# Patient Record
Sex: Female | Born: 2000
Health system: Southern US, Community
[De-identification: ages and names within clinical notes are randomized; demographics above are authoritative.]

## PROBLEM LIST (undated history)

## (undated) DIAGNOSIS — Z789 Other specified health status: Secondary | ICD-10-CM

## (undated) DIAGNOSIS — L709 Acne, unspecified: Secondary | ICD-10-CM

## (undated) DIAGNOSIS — B009 Herpesviral infection, unspecified: Secondary | ICD-10-CM

## (undated) HISTORY — PX: TONSILLECTOMY: SUR1361

## (undated) HISTORY — DX: Herpesviral infection, unspecified: B00.9

## (undated) HISTORY — DX: Acne, unspecified: L70.9

---
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
## 2018-01-01 NOTE — Telephone Encounter (Signed)
Phone call to patient's mother Lauren Coleman. Notified letter has been sent to Mychart, reviewed need for patient follow up appointment if fungus is worsening. Lauren Coleman states she will call back to make appointment later after reviewing Yannet's schedule.

## 2018-01-02 ENCOUNTER — Ambulatory Visit: Payer: BLUE CROSS/BLUE SHIELD | Admitting: Family Medicine

## 2018-01-04 ENCOUNTER — Encounter: Payer: Self-pay | Admitting: Physician Assistant

## 2018-01-04 ENCOUNTER — Ambulatory Visit: Payer: BLUE CROSS/BLUE SHIELD | Admitting: Physician Assistant

## 2018-01-04 ENCOUNTER — Other Ambulatory Visit: Payer: Self-pay

## 2018-01-04 VITALS — BP 118/80 | HR 115 | Temp 98.9°F | Resp 18 | Ht 66.0 in | Wt 170.6 lb

## 2018-01-04 DIAGNOSIS — L608 Other nail disorders: Secondary | ICD-10-CM

## 2018-01-04 DIAGNOSIS — B009 Herpesviral infection, unspecified: Secondary | ICD-10-CM | POA: Diagnosis not present

## 2018-01-04 MED ORDER — VALACYCLOVIR HCL 1 G PO TABS: 2000.0000 mg | ORAL_TABLET | Freq: Two times a day (BID) | ORAL | 2 refills | Status: DC

## 2018-01-04 MED ORDER — TERBINAFINE HCL 250 MG PO TABS: 250.0000 mg | ORAL_TABLET | Freq: Every day | ORAL | 1 refills | Status: DC

## 2018-01-04 NOTE — Progress Notes (Signed)
Lauren Coleman  MRN: [id number] DOB: [birth year]  PCP: Morrell Riddle, PA-C  Chief Complaint  Patient presents with  . Nail Problem    right foot little 4th and 5th toes follow up saw Dr. Neva Seat [phone]     Subjective:  Pt presents to clinic for concerns that she has toenail fungus.  Several months ago she was seen for discoloration of her fifth toe and was treated for cellulitis with antibiotics.  Since that time she is continued to have worsening yellowing/whitening roughness fourth and fifth toe on right foot.  Her father toenail fungus on all 10 fingers in the shower in the same shower patient is worried she has a fungus.  She does have dry peeling skin on the bottom of her feet which she has had for a long time.  She has a long history of oral HSV and is out of her Valtrex and would like a refill for that today.  History is obtained by patient and mother.  Review of Systems  Constitutional: Negative for chills and fever.    There are no active problems to display for this patient.   Current Outpatient Medications on File Prior to Visit  Medication Sig Dispense Refill  . Hyoscyamine Sulfate SL (LEVSIN/SL) 0.125 MG SUBL Place 0.125 mg under the tongue 3 (three) times daily as needed. For abdominal cramping 30 each 0  . SPRINTEC 28 0.25-35 MG-MCG tablet   11   No current facility-administered medications on file prior to visit.     No Known Allergies  Past Medical History:  Diagnosis Date  . Acne   . HSV-1 infection    Social History   Social History Narrative  . Not on file   Social History   Tobacco Use  . Smoking status: Never Smoker  . Smokeless tobacco: Never Used  Substance Use Topics  . Alcohol use: No    Alcohol/week: 0.0 oz  . Drug use: No   family history includes Anxiety disorder in her father; Arthritis in her maternal grandfather; Brain cancer in her maternal grandfather; Breast cancer in her maternal aunt; Colitis in her mother; Colon cancer in  her maternal uncle; Diabetes in her paternal grandfather; Healthy in her paternal uncle; Hyperlipidemia in her father and paternal grandmother; Hypertension in her father, maternal grandmother, and paternal grandmother; Hypothyroidism in her maternal grandmother; Ovarian cancer in her maternal grandmother; Sarcoidosis in her mother; Stroke in her paternal grandfather.     Objective:  BP 118/80   Pulse (!) 115   Temp 98.9 F (37.2 C) (Oral)   Resp 18   Ht  (1.676 m)   Wt 170 lb 9.6 oz (77.4 kg)   LMP 01/01/2018   SpO2 97%   BMI 27.54 kg/m  Body mass index is 27.54 kg/m.  Physical Exam  Constitutional: She is oriented to person, place, and time. She appears well-developed and well-nourished.  HENT:  Head: Normocephalic and atraumatic.  Right Ear: Hearing and external ear normal.  Left Ear: Hearing and external ear normal.  Eyes: Conjunctivae are normal.  Neck: Normal range of motion.  Pulmonary/Chest: Effort normal.  Neurological: She is alert and oriented to person, place, and time.  Skin: Skin is warm, dry and intact.  Right fourth toe nail a small amount 1/4 part of nail white and rough.  Right fifth toenail rough and approximately 50% white. no thickening of the nail noticed.  End of nail not involved.  Bottom of her foot is mildly  erythematous and scaling consistent with tinea pedis.  Psychiatric: She has a normal mood and affect. Her behavior is normal. Judgment and thought content normal.  Vitals reviewed.   Assessment and Plan :  Change in nail appearance - Plan: Hepatic Function Panel, terbinafine (LAMISIL) 250 MG tablet -100% sure this is actually toenail fungus.  But she definitely has tinea PDS.  There is no nail to trim which is affected to send for culture today.  We will check her liver functions and start Lamisil if normal.  She will recheck with me in 6 weeks at that time we will reevaluate  HSV infection - Plan: valACYclovir (VALTREX) 1000 MG tablet refilled  medication for patient.-   Patient verbalized to me that she understood the following: their diagnosis, what is being done for it, what to expect and what should be done at home.  See after visit summary for patient specific instructions.     Benny Lennert PA-C  Primary Care at [facility] 01/04/2018 12:57 PM

## 2018-01-04 NOTE — Patient Instructions (Signed)
     IF you received an x-ray today, you will receive an invoice from [facility]. Please contact [facility] at [phone] with questions or concerns regarding your invoice.   IF you received labwork today, you will receive an invoice from [facility]. Please contact [facility] at [phone] with questions or concerns regarding your invoice.   Our billing staff will not be able to assist you with questions regarding bills from these companies.  You will be contacted with the lab results as soon as they are available. The fastest way to get your results is to activate your My Chart account. Instructions are located on the last page of this paperwork. If you have not heard from us regarding the results in 2 weeks, please contact this office.     

## 2018-01-05 LAB — HEPATIC FUNCTION PANEL
ALK PHOS: 83 IU/L (ref 43–101)
ALT: 13 IU/L (ref 0–32)
AST: 18 IU/L (ref 0–40)
Albumin: 4.5 g/dL (ref 3.5–5.5)
Bilirubin Total: 0.3 mg/dL (ref 0.0–1.2)
Bilirubin, Direct: 0.09 mg/dL (ref 0.00–0.40)
TOTAL PROTEIN: 6.9 g/dL (ref 6.0–8.5)

## 2018-02-06 ENCOUNTER — Ambulatory Visit: Payer: BLUE CROSS/BLUE SHIELD | Admitting: Physician Assistant

## 2018-02-06 ENCOUNTER — Encounter: Payer: Self-pay | Admitting: Physician Assistant

## 2018-02-06 ENCOUNTER — Other Ambulatory Visit: Payer: Self-pay

## 2018-02-06 VITALS — BP 112/78 | HR 111 | Temp 97.9°F | Resp 18 | Ht 66.0 in | Wt 168.4 lb

## 2018-02-06 DIAGNOSIS — R300 Dysuria: Secondary | ICD-10-CM | POA: Diagnosis not present

## 2018-02-06 DIAGNOSIS — N3001 Acute cystitis with hematuria: Secondary | ICD-10-CM

## 2018-02-06 DIAGNOSIS — L608 Other nail disorders: Secondary | ICD-10-CM | POA: Diagnosis not present

## 2018-02-06 LAB — POC MICROSCOPIC URINALYSIS (UMFC): Mucus: ABSENT

## 2018-02-06 LAB — POCT URINALYSIS DIP (MANUAL ENTRY)
Bilirubin, UA: NEGATIVE
Glucose, UA: 100 mg/dL — AB
Ketones, POC UA: NEGATIVE mg/dL
Nitrite, UA: POSITIVE — AB
Protein Ur, POC: 30 mg/dL — AB
Urobilinogen, UA: 1 E.U./dL
pH, UA: 5 (ref 5.0–8.0)

## 2018-02-06 MED ORDER — NITROFURANTOIN MONOHYD MACRO 100 MG PO CAPS: 100.0000 mg | ORAL_CAPSULE | Freq: Two times a day (BID) | ORAL | 0 refills | Status: DC

## 2018-02-06 MED ORDER — TERBINAFINE HCL 250 MG PO TABS: 250.0000 mg | ORAL_TABLET | Freq: Every day | ORAL | 0 refills | Status: DC

## 2018-02-06 NOTE — Patient Instructions (Signed)
     IF you received an x-ray today, you will receive an invoice from [facility]. Please contact [facility] at [phone] with questions or concerns regarding your invoice.   IF you received labwork today, you will receive an invoice from [facility]. Please contact [facility] at [phone] with questions or concerns regarding your invoice.   Our billing staff will not be able to assist you with questions regarding bills from these companies.  You will be contacted with the lab results as soon as they are available. The fastest way to get your results is to activate your My Chart account. Instructions are located on the last page of this paperwork. If you have not heard from us regarding the results in 2 weeks, please contact this office.     

## 2018-02-06 NOTE — Progress Notes (Signed)
Lauren Coleman  MRN: [id number] DOB: 05/02/2001  PCP: Morrell RiddleWeber, Sarah L, PA-C  Chief Complaint  Patient presents with  . Nail Problem    follow up  . Urinary Tract Infection    dysuria     Subjective:  Pt presents to clinic for 2 concerns 1- tolerating the lamasil fine - feels like the new toenail growth is smoother  2- went to beach with boyfriend 10 days ago - used old spice soap in her vaginal area - dysuria - having bladder spasms and then azo she took last night and it helped the spasm a lot. Urinary freuqency and urgerncy  Planning on going to [employer] for 2 years and then wants to transfer to [place]  History is obtained by patient.  Review of Systems  Constitutional: Negative for chills and fever.  Gastrointestinal: Negative for nausea and vomiting.  Genitourinary: Positive for dysuria, frequency and urgency. Negative for menstrual problem, vaginal bleeding and vaginal discharge.    There are no active problems to display for this patient.   Current Outpatient Medications on File Prior to Visit  Medication Sig Dispense Refill  . Hyoscyamine Sulfate SL (LEVSIN/SL) 0.125 MG SUBL Place 0.125 mg under the tongue 3 (three) times daily as needed. For abdominal cramping 30 each 0  . SPRINTEC 28 0.25-35 MG-MCG tablet   11  . valACYclovir (VALTREX) 1000 MG tablet Take 2 tablets (2,000 mg total) by mouth 2 (two) times daily. For 1 day at first sign of outbreak 30 tablet 2   No current facility-administered medications on file prior to visit.     No Known Allergies  Past Medical History:  Diagnosis Date  . Acne   . HSV-1 infection    Social History   Social History Narrative   Lives with parents      Going to [employer] Fall 2019 - plan if for 2 years and then [place] - wants to work in [employer]   Social History   Tobacco Use  . Smoking status: Never Smoker  . Smokeless tobacco: Never Used  Substance Use Topics  . Alcohol use: No    Alcohol/week: 0.0 oz  . Drug  use: No   family history includes Anxiety disorder in her father; Arthritis in her maternal grandfather; Brain cancer in her maternal grandfather; Breast cancer in her maternal aunt; Colitis in her mother; Colon cancer in her maternal uncle; Diabetes in her paternal grandfather; Healthy in her paternal uncle; Hyperlipidemia in her father and paternal grandmother; Hypertension in her father, maternal grandmother, and paternal grandmother; Hypothyroidism in her maternal grandmother; Ovarian cancer in her maternal grandmother; Sarcoidosis in her mother; Stroke in her paternal grandfather.     Objective:  BP 112/78   Pulse (!) 111   Temp 97.9 F (36.6 C) (Oral)   Resp 18   Ht 5\' 6"  (1.676 m)   Wt 168 lb 6.4 oz (76.4 kg)   LMP 01/29/2018   SpO2 98%   BMI 27.18 kg/m  Body mass index is 27.18 kg/m.  Wt Readings from Last 3 Encounters:  02/06/18 168 lb 6.4 oz (76.4 kg) (92 %, Z= 1.40)*  01/04/18 170 lb 9.6 oz (77.4 kg) (93 %, Z= 1.46)*  12/10/17 170 lb 6.4 oz (77.3 kg) (93 %, Z= 1.46)*   * Growth percentiles are based on CDC (Girls, 2-20 Years) data.    Physical Exam  Constitutional: She is oriented to person, place, and time. She appears well-developed and well-nourished.  HENT:  Head:  Normocephalic and atraumatic.  Right Ear: External ear normal.  Left Ear: External ear normal.  Eyes: Conjunctivae are normal.  Neck: Normal range of motion.  Cardiovascular: Normal rate, regular rhythm and normal heart sounds.  No murmur heard. Pulmonary/Chest: Effort normal and breath sounds normal.  Abdominal: Soft. There is tenderness in the suprapubic area. There is no CVA tenderness.  Neurological: She is alert and oriented to person, place, and time.  Skin: Skin is warm, dry and intact.  Psychiatric: She has a normal mood and affect. Her behavior is normal. Judgment and thought content normal.  Vitals reviewed.  Results for orders placed or performed in visit on 02/06/18  POCT urinalysis  dipstick  Result Value Ref Range   Color, UA orange (A) yellow   Clarity, UA clear clear   Glucose, UA =100 (A) negative mg/dL   Bilirubin, UA negative negative   Ketones, POC UA negative negative mg/dL   Spec Grav, UA <=1.610 (A) 1.010 - 1.025   Blood, UA moderate (A) negative   pH, UA 5.0 5.0 - 8.0   Protein Ur, POC =30 (A) negative mg/dL   Urobilinogen, UA 1.0 0.2 or 1.0 E.U./dL   Nitrite, UA Positive (A) Negative   Leukocytes, UA Large (3+) (A) Negative  POCT Microscopic Urinalysis ([facility])  Result Value Ref Range   WBC,UR,HPF,POC Too numerous to count  (A) None WBC/hpf   RBC,UR,HPF,POC Few (A) None RBC/hpf   Bacteria Moderate (A) None, Too numerous to count   Mucus Absent Absent   Epithelial Cells, UR Per Microscopy Few (A) None, Too numerous to count cells/hpf    Assessment and Plan :  Dysuria - Plan: POCT urinalysis dipstick, POCT Microscopic Urinalysis ([facility]), Urine Culture - push fluids - finish all medications -   Change in nail appearance - Plan: Hepatic Function Panel, terbinafine (LAMISIL) 250 MG tablet - continue for a total of 3 months - appears to be improved  Acute cystitis with hematuria - Plan: nitrofurantoin, macrocrystal-monohydrate, (MACROBID) 100 MG capsule  Patient verbalized to me that they understand the following: diagnosis, what is being done for them, what to expect and what should be done at home.  Their questions have been answered.  See after visit summary for patient specific instructions.  Benny Lennert PA-C  Primary Care at [facility] 02/06/2018 11:07 AM  Please note: Portions of this report may have been transcribed using dragon voice recognition software. Every effort was made to ensure accuracy; however, inadvertent computerized transcription errors may be present.

## 2018-02-07 LAB — HEPATIC FUNCTION PANEL
ALT: 35 IU/L — ABNORMAL HIGH (ref 0–32)
AST: 25 IU/L (ref 0–40)
Albumin: 4.5 g/dL (ref 3.5–5.5)
Alkaline Phosphatase: 87 IU/L (ref 43–101)
BILIRUBIN TOTAL: 0.3 mg/dL (ref 0.0–1.2)
BILIRUBIN, DIRECT: 0.08 mg/dL (ref 0.00–0.40)
TOTAL PROTEIN: 6.5 g/dL (ref 6.0–8.5)

## 2018-02-08 LAB — URINE CULTURE

## 2018-05-31 ENCOUNTER — Inpatient Hospital Stay (HOSPITAL_COMMUNITY)
Admission: AD | Admit: 2018-05-31 | Discharge: 2018-06-01 | Disposition: A | Discharge status: Home / Self Care | Payer: BLUE CROSS/BLUE SHIELD | Source: Ambulatory Visit | Attending: Obstetrics and Gynecology | Admitting: Obstetrics and Gynecology

## 2018-05-31 DIAGNOSIS — R109 Unspecified abdominal pain: Secondary | ICD-10-CM

## 2018-05-31 DIAGNOSIS — M545 Low back pain: Secondary | ICD-10-CM | POA: Diagnosis not present

## 2018-05-31 DIAGNOSIS — R1030 Lower abdominal pain, unspecified: Secondary | ICD-10-CM | POA: Insufficient documentation

## 2018-05-31 HISTORY — DX: Other specified health status: Z78.9

## 2018-05-31 LAB — URINALYSIS, ROUTINE W REFLEX MICROSCOPIC
BILIRUBIN URINE: NEGATIVE
Glucose, UA: NEGATIVE mg/dL
HGB URINE DIPSTICK: NEGATIVE
Ketones, ur: NEGATIVE mg/dL
NITRITE: NEGATIVE
Protein, ur: NEGATIVE mg/dL
SPECIFIC GRAVITY, URINE: 1.024 (ref 1.005–1.030)
pH: 6 (ref 5.0–8.0)

## 2018-05-31 LAB — POCT PREGNANCY, URINE: PREG TEST UR: NEGATIVE

## 2018-05-31 NOTE — MAU Note (Addendum)
For last 2 wks having bad period-like cramps but worse. Tonight pain is horrible and hard to get out of bed. Pain is in lower abd and goes to back. Today spotted some pink to dark red blood. Took ibuprofen 3 200mg  tabs at 2145 and helped alittle

## 2018-06-01 ENCOUNTER — Inpatient Hospital Stay (HOSPITAL_COMMUNITY): Payer: BLUE CROSS/BLUE SHIELD

## 2018-06-01 ENCOUNTER — Encounter (HOSPITAL_COMMUNITY): Payer: Self-pay | Admitting: *Deleted

## 2018-06-01 DIAGNOSIS — R1030 Lower abdominal pain, unspecified: Secondary | ICD-10-CM | POA: Diagnosis not present

## 2018-06-01 DIAGNOSIS — M545 Low back pain: Secondary | ICD-10-CM | POA: Diagnosis not present

## 2018-06-01 LAB — WET PREP, GENITAL
CLUE CELLS WET PREP: NONE SEEN
Sperm: NONE SEEN
Trich, Wet Prep: NONE SEEN
Yeast Wet Prep HPF POC: NONE SEEN

## 2018-06-01 LAB — CBC
HEMATOCRIT: 35.3 % — AB (ref 36.0–46.0)
Hemoglobin: 12.3 g/dL (ref 12.0–15.0)
MCH: 29.4 pg (ref 26.0–34.0)
MCHC: 34.8 g/dL (ref 30.0–36.0)
MCV: 84.2 fL (ref 80.0–100.0)
NRBC: 0 % (ref 0.0–0.2)
Platelets: 362 10*3/uL (ref 150–400)
RBC: 4.19 MIL/uL (ref 3.87–5.11)
RDW: 13.2 % (ref 11.5–15.5)
WBC: 8.8 10*3/uL (ref 4.0–10.5)

## 2018-06-01 LAB — HCG, QUANTITATIVE, PREGNANCY: hCG, Beta Chain, Quant, S: <1 m[IU]/mL (ref <5)

## 2018-06-01 NOTE — Discharge Instructions (Signed)
Your ultrasound was normal and did not show any ovarian cysts, twisted ovaries or fluid in the abdominal cavity. Your lab work was all unremarkable. If your pain persists please follow up with your PCP. You need to be seen if you have new vaginal discharge, pain with intercourse, missed menstrual period, fevers, or vomiting.

## 2018-06-01 NOTE — MAU Provider Note (Signed)
History     CSN: [id number]  Arrival date and time: 05/31/18 2208   First Provider Initiated Contact with Patient 06/01/18 0052      Chief Complaint  Patient presents with  . Abdominal Pain   HPI   Lauren Coleman is a 17 y.o. G0P0000 female who presents to [facility]U with lower quadrant abdominal pain. Reports she has had intermittent cramping pain on both sides (sometimes unilateral and sometimes bilateral) for the past two weeks that has felt similar to period cramps. Her LMP was 9/25 and was normal. No recent missed menses. Denies history of irregular or painful menstrual cycles. Patient is sexually active. Has never had STD. Denies vaginal discharge or odor. Denies nausea, vomiting and fevers.    OB History    Gravida  0   Para  0   Term  0   Preterm  0   AB  0   Living  0     SAB  0   TAB  0   Ectopic  0   Multiple  0   Live Births  0           Past Medical History:  Diagnosis Date  . Acne   . HSV-1 infection   . Medical history non-contributory     Past Surgical History:  Procedure Laterality Date  . TONSILLECTOMY      Family History  Problem Relation Age of Onset  . Colitis Mother   . Sarcoidosis Mother   . Hypertension Father   . Hyperlipidemia Father   . Anxiety disorder Father   . Ovarian cancer Maternal Grandmother   . Hypertension Maternal Grandmother   . Hypothyroidism Maternal Grandmother   . Brain cancer Maternal Grandfather   . Arthritis Maternal Grandfather   . Hyperlipidemia Paternal Grandmother   . Hypertension Paternal Grandmother   . Diabetes Paternal Grandfather   . Stroke Paternal Grandfather   . Colon cancer Maternal Uncle   . Breast cancer Maternal Aunt   . Healthy Paternal Uncle     Social History   Tobacco Use  . Smoking status: Never Smoker  . Smokeless tobacco: Never Used  Substance Use Topics  . Alcohol use: No    Alcohol/week: 0.0 standard drinks  . Drug use: No    Allergies: No Known  Allergies  Medications Prior to Admission  Medication Sig Dispense Refill Last Dose  . acetaminophen (TYLENOL) 325 MG tablet Take 650 mg by mouth every 6 (six) hours as needed.   Past Month at Unknown time  . ibuprofen (ADVIL,MOTRIN) 200 MG tablet Take 600 mg by mouth every 6 (six) hours as needed.   05/31/2018 at Unknown time  . SPRINTEC 28 0.25-35 MG-MCG tablet   11 05/31/2018 at Unknown time  . Hyoscyamine Sulfate SL (LEVSIN/SL) 0.125 MG SUBL Place 0.125 mg under the tongue 3 (three) times daily as needed. For abdominal cramping 30 each 0 Taking  . nitrofurantoin, macrocrystal-monohydrate, (MACROBID) 100 MG capsule Take 1 capsule (100 mg total) by mouth 2 (two) times daily. 14 capsule 0   . terbinafine (LAMISIL) 250 MG tablet Take 1 tablet (250 mg total) by mouth daily. 30 tablet 0   . valACYclovir (VALTREX) 1000 MG tablet Take 2 tablets (2,000 mg total) by mouth 2 (two) times daily. For 1 day at first sign of outbreak 30 tablet 2 Taking    Review of Systems  Constitutional: Negative for activity change, appetite change, chills, fatigue and fever.  Respiratory: Negative for shortness of  breath.   Cardiovascular: Negative for chest pain.  Gastrointestinal: Positive for abdominal pain. Negative for constipation, diarrhea, nausea and vomiting.  Genitourinary: Negative for decreased urine volume, difficulty urinating, dysuria, flank pain, frequency, hematuria, urgency, vaginal discharge and vaginal pain.  Skin: Negative for rash.   Physical Exam   Blood pressure 130/82, pulse 85, temperature 98.2 F (36.8 C), resp. rate 18, height 5' 6.5" (1.689 m), weight 80.3 kg, last menstrual period 05/08/2018.  Physical Exam  Constitutional: She is oriented to person, place, and time. She appears well-developed and well-nourished. No distress.  HENT:  Head: Normocephalic and atraumatic.  Eyes: Conjunctivae and EOM are normal.  Neck: Normal range of motion. Neck supple.  Cardiovascular: Normal  rate, regular rhythm and normal heart sounds.  No murmur heard. Respiratory: Effort normal and breath sounds normal. No respiratory distress.  GI: Soft. Bowel sounds are normal. She exhibits no distension.  TTP diffusely across lower quadrants, non-localizing. No rebound or guarding present.   Genitourinary: Vagina normal and uterus normal. Uterus is not tender. Cervix exhibits no motion tenderness, no discharge and no friability. Right adnexum displays no mass and no tenderness. Left adnexum displays no mass and no tenderness. No vaginal discharge found.  Musculoskeletal: Normal range of motion.  Neurological: She is alert and oriented to person, place, and time.  Skin: Skin is warm and dry. She is not diaphoretic.  Psychiatric: She has a normal mood and affect. Her behavior is normal.    MAU Course  Procedures  MDM Pelvic exam performed and unremarkable. Wet prep with moderate WBCs, otherwise unremarkable. GC/chlamydia collected. Transvaginal ultrasound obtained due to bilateral lower abdominal tenderness. Scan negative.   Assessment and Plan   1. Lower abdominal pain   2. Abdominal pain    Upreg negative and Bhcg <1 ruling out early pregnancy as etiology of symptoms.Transvaginal ultrasound negative ruling out ovarian cyst, torsion, or TOA as etiology of symptoms. Alvarado score of 2: unlikely appendicitis.Patient without evidence of cervicitis on exam and no CMT so lower suspicion for PID. GC/chlamydia pending. CBC without leukocytosis and patient afebrile with stable vital signs making infectious process less likely. Strict return precautions discussed. Patient stable for d/c home. Low suspicion for acute process needing urgent treatment. If symptoms persist, would recommend follow up with PCP.   De Hollingshead 06/01/2018, 12:52 AM

## 2018-06-01 NOTE — Progress Notes (Signed)
Written and verbal d/c instructions given by Vira Blanco RN. Pt voiced understanding

## 2018-06-03 LAB — GC/CHLAMYDIA PROBE AMP (~~LOC~~) NOT AT ARMC
Chlamydia: NEGATIVE
Neisseria Gonorrhea: NEGATIVE

## 2018-06-21 ENCOUNTER — Encounter: Payer: Self-pay | Admitting: Medical

## 2018-06-21 ENCOUNTER — Ambulatory Visit: Payer: BLUE CROSS/BLUE SHIELD | Admitting: Medical

## 2018-06-21 VITALS — BP 127/72 | HR 86 | Temp 98.1°F | Resp 16 | Ht 66.0 in | Wt 179.0 lb

## 2018-06-21 DIAGNOSIS — J0110 Acute frontal sinusitis, unspecified: Secondary | ICD-10-CM

## 2018-06-21 DIAGNOSIS — F419 Anxiety disorder, unspecified: Secondary | ICD-10-CM

## 2018-06-21 DIAGNOSIS — F32A Depression, unspecified: Secondary | ICD-10-CM

## 2018-06-21 DIAGNOSIS — F329 Major depressive disorder, single episode, unspecified: Secondary | ICD-10-CM | POA: Diagnosis not present

## 2018-06-21 MED ORDER — BENZONATATE 100 MG PO CAPS: 100.0000 mg | ORAL_CAPSULE | Freq: Three times a day (TID) | ORAL | 0 refills | Status: DC | PRN

## 2018-06-21 MED ORDER — DOXYCYCLINE HYCLATE 100 MG PO TABS: 100.0000 mg | ORAL_TABLET | Freq: Two times a day (BID) | ORAL | 0 refills | Status: DC

## 2018-06-21 MED ORDER — FLUTICASONE PROPIONATE 50 MCG/ACT NA SUSP: 2.0000 | Freq: Every day | NASAL | 1 refills | Status: DC

## 2018-06-21 MED ORDER — SERTRALINE HCL 25 MG PO TABS: 25.0000 mg | ORAL_TABLET | Freq: Every day | ORAL | 0 refills | Status: DC

## 2018-06-21 MED FILL — BENZONATATE 100 MG CAPSULE: 100 | 10 days supply | Qty: 30 | Fill #0

## 2018-06-21 MED FILL — DOXYCYCLINE HYCLATE 100 MG: 100 | 7 days supply | Qty: 14 | Fill #0

## 2018-06-21 MED FILL — SERTRALINE HCL 25 MG TABLET: 25 | 30 days supply | Qty: 30 | Fill #0

## 2018-06-21 MED FILL — FLUTICASONE PROP 50 MCG SPR: 50 | 30 days supply | Qty: 16 | Fill #0

## 2018-06-21 NOTE — Progress Notes (Signed)
Subjective:    Patient ID: Lauren Coleman, female    DOB: [birth year], 17 y.o.   MRN: [id number]  HPI  Pt has 2 weeks of sinus pressure, nasal congestion and green colored mucus when blows nose. No fever, no chills or sweats. Pt has had some sneezing but no no itching eyes.  Pt does not report fall allergies.  LMP- 2 weeks ago.   Also she has been having some recent anxiety. Almost having anxeity attacks. She is freshman in college. She is worried about everything now.Some mid depressed mood. Mom is on celexa and dad is on sertraline.(mom and express she does not need benzo type med)    Review of Systems  Constitutional: Negative for chills, fatigue and fever.  HENT: Positive for congestion, sinus pressure and sinus pain. Negative for ear pain, postnasal drip, sneezing and trouble swallowing.   Respiratory: Negative for cough, choking, shortness of breath and wheezing.   Cardiovascular: Negative for chest pain and palpitations.  Gastrointestinal: Negative for abdominal pain.  Musculoskeletal: Negative for back pain and neck pain.  Skin: Negative for rash.  Neurological: Negative for dizziness, tremors, speech difficulty and headaches.  Hematological: Negative for adenopathy. Does not bruise/bleed easily.  Psychiatric/Behavioral: Positive for dysphoric mood. Negative for agitation, behavioral problems and suicidal ideas. The patient is nervous/anxious.        Years of anxiety and depression.    Past Medical History:  Diagnosis Date  . Acne   . HSV-1 infection   . Medical history non-contributory      Social History   Socioeconomic History  . Marital status: Single    Spouse name: Not on file  . Number of children: Not on file  . Years of education: Not on file  . Highest education level: Not on file  Occupational History  . Not on file  Social Needs  . Financial resource strain: Not on file  . Food insecurity:    Worry: Not on file    Inability: Not on file  .  Transportation needs:    Medical: Not on file    Non-medical: Not on file  Tobacco Use  . Smoking status: Never Smoker  . Smokeless tobacco: Never Used  Substance and Sexual Activity  . Alcohol use: No    Alcohol/week: 0.0 standard drinks  . Drug use: No  . Sexual activity: Yes    Birth control/protection: Pill  Lifestyle  . Physical activity:    Days per week: Not on file    Minutes per session: Not on file  . Stress: Not on file  Relationships  . Social connections:    Talks on phone: Not on file    Gets together: Not on file    Attends religious service: Not on file    Active member of club or organization: Not on file    Attends meetings of clubs or organizations: Not on file    Relationship status: Not on file  . Intimate partner violence:    Fear of current or ex partner: Not on file    Emotionally abused: Not on file    Physically abused: Not on file    Forced sexual activity: Not on file  Other Topics Concern  . 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 TONSILLECTOMY      Family History  Problem Relation Age of Onset  . Colitis Mother   . Sarcoidosis Mother   . Hypertension Father   . Hyperlipidemia Father   . Anxiety disorder Father   . Ovarian cancer Maternal Grandmother   . Hypertension Maternal Grandmother   . Hypothyroidism Maternal Grandmother   . Brain cancer Maternal Grandfather   . Arthritis Maternal Grandfather   . Hyperlipidemia Paternal Grandmother   . Hypertension Paternal Grandmother   . Diabetes Paternal Grandfather   . Stroke Paternal Grandfather   . Colon cancer Maternal Uncle   . Breast cancer Maternal Aunt   . Healthy Paternal Uncle     No Known Allergies  Current Outpatient Medications on File Prior to Visit  Medication Sig Dispense Refill  .  SPRINTEC 28 0.25-35 MG-MCG tablet   11  . valACYclovir (VALTREX) 1000 MG tablet Take 2 tablets (2,000 mg total) by mouth 2 (two) times daily. For 1 day at first sign of outbreak 30 tablet 2   No current facility-administered medications on file prior to visit.     BP 127/72   Pulse 86   Temp 98.1 F (36.7 C) (Oral)   Resp 16   Ht 5\' 6"  (1.676 m)   Wt 179 lb (81.2 kg)   SpO2 99%   BMI 28.89 kg/m       Objective:   Physical Exam  General  Mental Status - Alert. General Appearance - Well groomed. Not in acute distress.  Skin Rashes- No Rashes.  HEENT Head- Normal. Ear Auditory Canal - Left- Normal. Right - Normal.Tympanic Membrane- Left- Normal. Right- Normal. Eye Sclera/Conjunctiva- Left- Normal. Right- Normal. Nose & Sinuses Nasal Mucosa- Left- mild  Boggy and Congested. Right- mild  Boggy and  Congested.Bilateral no  maxillary but   Mild frontal sinus pressure and ethmoid area. Mouth & Throat Lips: Upper Lip- Normal: no dryness, cracking, pallor, cyanosis, or vesicular eruption. Lower Lip-Normal: no dryness, cracking, pallor, cyanosis or vesicular eruption. Buccal Mucosa- Bilateral- No Aphthous ulcers. Oropharynx- No Discharge or Erythema. +pnd Tonsils: Characteristics- Bilateral- No Erythema or Congestion. Size/Enlargement- Bilateral- No enlargement. Discharge- bilateral-None.  Neck Neck- Supple. No Masses.   Chest and Lung Exam Auscultation: Breath Sounds:-Clear even and unlabored.  Cardiovascular Auscultation:Rythm- Regular, rate and rhythm. Murmurs & Other Heart Sounds:Ausculatation of the heart reveal- No Murmurs.  Lymphatic Head & Neck General Head & Neck Lymphatics: Bilateral: Description- No Localized lymphadenopathy.       Assessment & Plan:  Your appear to have a sinus infection. I am prescribing doxycycline  antibiotic for the infection(rx advisement given . To help with the nasal congestion I prescribed nasal steroid flonase. If associated  cough, I prescribed cough medicine rx benzonatate.  For anxiety and depression, I wrote sertraline low dose. Rx advisement given  Rest, hydrate, tylenol for fever.  Follow up in 2 weeks  or as needed.

## 2018-06-21 NOTE — Patient Instructions (Addendum)
Your appear to have a sinus infection. I am prescribing doxycycline  antibiotic for the infection(rx advisement given . To help with the nasal congestion I prescribed nasal steroid flonase. If associated cough, I prescribed cough medicine rx benzonatate.  For anxiety and depression, I wrote sertraline low dose. Rx advisement given  Rest, hydrate, tylenol for fever.  Follow up in 2 weeks(office or my chart but need update)  or as needed.

## 2018-07-23 DIAGNOSIS — Z3200 Encounter for pregnancy test, result unknown: Secondary | ICD-10-CM | POA: Diagnosis not present

## 2018-07-23 DIAGNOSIS — L709 Acne, unspecified: Secondary | ICD-10-CM | POA: Diagnosis not present

## 2018-07-23 DIAGNOSIS — Z3009 Encounter for other general counseling and advice on contraception: Secondary | ICD-10-CM | POA: Diagnosis not present

## 2018-08-11 ENCOUNTER — Telehealth: Payer: BLUE CROSS/BLUE SHIELD | Admitting: Family

## 2018-08-11 DIAGNOSIS — R05 Cough: Secondary | ICD-10-CM

## 2018-08-11 DIAGNOSIS — R059 Cough, unspecified: Secondary | ICD-10-CM

## 2018-08-11 MED ORDER — PREDNISONE 10 MG (21) PO TBPK: ORAL_TABLET | ORAL | 0 refills | Status: DC

## 2018-08-11 NOTE — Progress Notes (Signed)

## 2018-11-08 DIAGNOSIS — Z01419 Encounter for gynecological examination (general) (routine) without abnormal findings: Secondary | ICD-10-CM | POA: Diagnosis not present

## 2018-11-08 DIAGNOSIS — Z6824 Body mass index (BMI) 24.0-24.9, adult: Secondary | ICD-10-CM | POA: Diagnosis not present

## 2019-02-26 DIAGNOSIS — N6089 Other benign mammary dysplasias of unspecified breast: Secondary | ICD-10-CM | POA: Diagnosis not present

## 2019-04-03 DIAGNOSIS — Z3202 Encounter for pregnancy test, result negative: Secondary | ICD-10-CM | POA: Diagnosis not present

## 2019-04-03 DIAGNOSIS — Z113 Encounter for screening for infections with a predominantly sexual mode of transmission: Secondary | ICD-10-CM | POA: Diagnosis not present

## 2019-04-03 DIAGNOSIS — N939 Abnormal uterine and vaginal bleeding, unspecified: Secondary | ICD-10-CM | POA: Diagnosis not present

## 2019-04-03 DIAGNOSIS — N760 Acute vaginitis: Secondary | ICD-10-CM | POA: Diagnosis not present

## 2019-04-17 DIAGNOSIS — N760 Acute vaginitis: Secondary | ICD-10-CM | POA: Diagnosis not present

## 2019-04-17 DIAGNOSIS — N921 Excessive and frequent menstruation with irregular cycle: Secondary | ICD-10-CM | POA: Diagnosis not present

## 2019-04-29 DIAGNOSIS — N760 Acute vaginitis: Secondary | ICD-10-CM | POA: Diagnosis not present

## 2019-04-30 DIAGNOSIS — R102 Pelvic and perineal pain: Secondary | ICD-10-CM | POA: Diagnosis not present

## 2019-04-30 DIAGNOSIS — N760 Acute vaginitis: Secondary | ICD-10-CM | POA: Diagnosis not present

## 2019-05-09 ENCOUNTER — Ambulatory Visit: Payer: BLUE CROSS/BLUE SHIELD | Admitting: Physician Assistant

## 2019-06-10 ENCOUNTER — Encounter: Payer: Self-pay | Admitting: Family Medicine

## 2019-06-10 ENCOUNTER — Other Ambulatory Visit: Payer: Self-pay

## 2019-06-10 ENCOUNTER — Ambulatory Visit (INDEPENDENT_AMBULATORY_CARE_PROVIDER_SITE_OTHER): Payer: BC Managed Care – PPO | Admitting: Family Medicine

## 2019-06-10 DIAGNOSIS — J208 Acute bronchitis due to other specified organisms: Secondary | ICD-10-CM

## 2019-06-10 DIAGNOSIS — B9689 Other specified bacterial agents as the cause of diseases classified elsewhere: Secondary | ICD-10-CM

## 2019-06-10 MED ORDER — AZITHROMYCIN 250 MG PO TABS: ORAL_TABLET | ORAL | 0 refills | Status: DC

## 2019-06-10 NOTE — Progress Notes (Signed)
Chief Complaint  Patient presents with  . Cough    congestion    Lauren Coleman here for URI complaints. Due to COVID-19 pandemic, we are interacting via web portal for an electronic face-to-face visit. I verified patient's ID using 2 identifiers. Patient agreed to proceed with visit via this method. Patient is at home, I am at office. Patient and I are present for visit.   Duration: 2 weeks  Associated symptoms: sinus congestion, rhinorrhea and productive cough Denies: sinus pain, itchy watery eyes, ear pain, ear drainage, sore throat, wheezing, shortness of breath, myalgia, digestive symptoms, and fevers Treatment to date: Nyquil, Mucinex Sick contacts: No  ROS:  Const: Denies fevers HEENT: As noted in HPI Lungs: No SOB  Past Medical History:  Diagnosis Date  . Acne   . HSV-1 infection   . Medical history non-contributory    Exam No conversational dyspnea Age appropriate judgment and insight Nml affect and mood  Acute bacterial bronchitis - Plan: azithromycin (ZITHROMAX) 250 MG tablet  Continue supportive care for the next 2-3 days.  If no improvement, use antibiotic as above. Continue to push fluids, practice good hand hygiene, cover mouth when coughing.  Offered testing for Covid, she declined. F/u prn. If starting to experience fevers, shaking, or shortness of breath, seek immediate care. Pt voiced understanding and agreement to the plan.  [place], DO 06/10/19 11:54 AM

## 2019-07-08 ENCOUNTER — Telehealth: Payer: Self-pay | Admitting: Medical

## 2019-07-08 NOTE — Telephone Encounter (Signed)
Called to offer appointment. Pt states she is really busy and will have to call back.

## 2019-07-08 NOTE — Telephone Encounter (Signed)
Pt is calling to see if dr. Nani Ravens can send in another anti biotic for her stuffy nose and mucus. Pt seen him for the issue and stated the zpack worked a little bit but not enough, so she wanted to know of another option to help relieve and clear up mucus / please advise

## 2019-10-29 ENCOUNTER — Other Ambulatory Visit: Payer: Self-pay

## 2019-10-30 ENCOUNTER — Ambulatory Visit (INDEPENDENT_AMBULATORY_CARE_PROVIDER_SITE_OTHER): Payer: BC Managed Care – PPO | Admitting: Medical

## 2019-10-30 ENCOUNTER — Other Ambulatory Visit: Payer: Self-pay

## 2019-10-30 VITALS — BP 118/81 | HR 76 | Temp 97.1°F | Resp 16 | Ht 68.0 in | Wt 141.0 lb

## 2019-10-30 DIAGNOSIS — Z0000 Encounter for general adult medical examination without abnormal findings: Secondary | ICD-10-CM | POA: Diagnosis not present

## 2019-10-30 DIAGNOSIS — Z111 Encounter for screening for respiratory tuberculosis: Secondary | ICD-10-CM

## 2019-10-30 LAB — POC URINALSYSI DIPSTICK (AUTOMATED)
Blood, UA: NEGATIVE
Glucose, UA: NEGATIVE
Ketones, UA: NEGATIVE
Leukocytes, UA: NEGATIVE
Nitrite, UA: NEGATIVE
Protein, UA: NEGATIVE
Spec Grav, UA: 1.01 (ref 1.010–1.025)
Urobilinogen, UA: 0.2 E.U./dL
pH, UA: 6 (ref 5.0–8.0)

## 2019-10-30 NOTE — Patient Instructions (Addendum)
For you wellness exam today I ordered tb screening blood test as required by program. Offered standard wellness labs but declined.  Vaccines up to date  Recommend exercise and healthy diet.  We will let you know lab results as they come in.  Follow up date appointment will be determined after lab review.   Filled out form today. Pt tb screening test pending.   Preventive Care 48-21 Years Old, Female Preventive care refers to lifestyle choices and visits with your health care provider that can promote health and wellness. At this stage in your life, you may start seeing a primary care physician instead of a pediatrician. Your health care is now your responsibility. Preventive care for young adults includes:  A yearly physical exam. This is also called an annual wellness visit.  Regular dental and eye exams.  Immunizations.  Screening for certain conditions.  Healthy lifestyle choices, such as diet and exercise. What can I expect for my preventive care visit? Physical exam Your health care provider may check:  Height and weight. These may be used to calculate body mass index (BMI), which is a measurement that tells if you are at a healthy weight.  Heart rate and blood pressure.  Body temperature. Counseling Your health care provider may ask you questions about:  Past medical problems and family medical history.  Alcohol, tobacco, and drug use.  Home and relationship well-being.  Access to firearms.  Emotional well-being.  Diet, exercise, and sleep habits.  Sexual activity and sexual health.  Method of birth control.  Menstrual cycle.  Pregnancy history. What immunizations do I need?  Influenza (flu) vaccine  This is recommended every year. Tetanus, diphtheria, and pertussis (Tdap) vaccine  You may need a Td booster every 10 years. Varicella (chickenpox) vaccine  You may need this vaccine if you have not already been vaccinated. Human papillomavirus  (HPV) vaccine  If recommended by your health care provider, you may need three doses over 6 months. Measles, mumps, and rubella (MMR) vaccine  You may need at least one dose of MMR. You may also need a second dose. Meningococcal conjugate (MenACWY) vaccine  One dose is recommended if you are 77-20 years old and a Market researcher living in a residence hall, or if you have one of several medical conditions. You may also need additional booster doses. Pneumococcal conjugate (PCV13) vaccine  You may need this if you have certain conditions and were not previously vaccinated. Pneumococcal polysaccharide (PPSV23) vaccine  You may need one or two doses if you smoke cigarettes or if you have certain conditions. Hepatitis A vaccine  You may need this if you have certain conditions or if you travel or work in places where you may be exposed to hepatitis A. Hepatitis B vaccine  You may need this if you have certain conditions or if you travel or work in places where you may be exposed to hepatitis B. Haemophilus influenzae type b (Hib) vaccine  You may need this if you have certain risk factors. You may receive vaccines as individual doses or as more than one vaccine together in one shot (combination vaccines). Talk with your health care provider about the risks and benefits of combination vaccines. What tests do I need? Blood tests  Lipid and cholesterol levels. These may be checked every 5 years starting at age 12.  Hepatitis C test.  Hepatitis B test. Screening  Pelvic exam and Pap test. This may be done every 3 years starting at age  21.  Sexually transmitted disease (STD) testing, if you are at risk.  BRCA-related cancer screening. This may be done if you have a family history of breast, ovarian, tubal, or peritoneal cancers. Other tests  Tuberculosis skin test.  Vision and hearing tests.  Skin exam.  Breast exam. Follow these instructions at home: Eating and  drinking   Eat a diet that includes fresh fruits and vegetables, whole grains, lean protein, and low-fat dairy products.  Drink enough fluid to keep your urine pale yellow.  Do not drink alcohol if: ? Your health care provider tells you not to drink. ? You are pregnant, may be pregnant, or are planning to become pregnant. ? You are under the legal drinking age. In the [place] the legal drinking age is 46.  If you drink alcohol: ? Limit how much you have to 0-1 drink a day. ? Be aware of how much alcohol is in your drink. In the [place] one drink equals one 12 oz bottle of beer (355 mL), one 5 oz glass of wine (148 mL), or one 1 oz glass of hard liquor (44 mL). Lifestyle  Take daily care of your teeth and gums.  Stay active. Exercise at least 30 minutes 5 or more days of the week.  Do not use any products that contain nicotine or tobacco, such as cigarettes, e-cigarettes, and chewing tobacco. If you need help quitting, ask your health care provider.  Do not use drugs.  If you are sexually active, practice safe sex. Use a condom or other form of birth control (contraception) in order to prevent pregnancy and STIs (sexually transmitted infections). If you plan to become pregnant, see your health care provider for a pre-conception visit.  Find healthy ways to cope with stress, such as: ? Meditation, yoga, or listening to music. ? Journaling. ? Talking to a trusted person. ? Spending time with friends and family. Safety  Always wear your seat belt while driving or riding in a vehicle.  Do not drive if you have been drinking alcohol. Do not ride with someone who has been drinking.  Do not drive when you are tired or distracted. Do not text while driving.  Wear a helmet and other protective equipment during sports activities.  If you have firearms in your house, make sure you follow all gun safety procedures.  Seek help if you have been bullied, physically abused, or sexually  abused.  Use the Internet responsibly to avoid dangers such as online bullying and online sex predators. What's next?  Go to your health care provider once a year for a well check visit.  Ask your health care provider how often you should have your eyes and teeth checked.  Stay up to date on all vaccines. This information is not intended to replace advice given to you by your health care provider. Make sure you discuss any questions you have with your health care provider. Document Revised: 07/25/2018 Document Reviewed: 07/25/2018 Elsevier Patient Education  2020 [employer].

## 2019-10-30 NOTE — Progress Notes (Signed)
   Subjective:    Patient ID: Georges Lynch, female    DOB: 11-12-2000, 19 y.o.   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 Pt states no cpe/wellness exam recently.  Pt has no major medical problems. No hx of asthma, seizures, passing out exercise, bleeding disorders or chest pain.  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  Pt due for tdap next year.   Pt does cross fit and weight lifting.   Review of Systems negative    Objective:   Physical Exam  General Mental Status- Alert. General Appearance- Not in acute distress.   Skin General: Color- Normal Color. Moisture- Normal Moisture.  Neck Carotid Arteries- Normal color. Moisture- Normal Moisture. No carotid bruits. No JVD.  Chest and Lung Exam Auscultation: Breath Sounds:-Normal.  Cardiovascular Auscultation:Rythm- Regular. Murmurs & Other Heart Sounds:Auscultation of the heart reveals- No Murmurs.  Abdomen Inspection:-Inspeection Normal. Palpation/Percussion:Note:No mass. Palpation and Percussion of the abdomen reveal- Non Tender, Non Distended + BS, no rebound or guarding.    Neurologic Cranial Nerve exam:- CN III-XII intact(No nystagmus), symmetric smile. Strength:- 5/5 equal and symmetric strength both upper and lower extremities.      Assessment & Plan:  For you wellness exam today I ordered tb screening blood test as required by program. Offered standard wellness labs but declined.  Vaccines up to date  Recommend exercise and healthy diet.  We will let you know lab results as they come in.  Follow up date appointment will be determined after lab review.   Esperanza Richters, PA-C Filled out form today. Pt tb screening test pending.

## 2019-11-01 LAB — QUANTIFERON-TB GOLD PLUS
Mitogen-NIL: >10 IU/mL
NIL: 0.02 IU/mL
QuantiFERON-TB Gold Plus: NEGATIVE
TB1-NIL: 0.01 IU/mL
TB2-NIL: 0 IU/mL

## 2019-11-03 ENCOUNTER — Encounter: Payer: Self-pay | Admitting: Medical

## 2019-11-21 ENCOUNTER — Ambulatory Visit: Payer: BC Managed Care – PPO | Admitting: Medical

## 2019-11-21 ENCOUNTER — Other Ambulatory Visit: Payer: Self-pay

## 2019-11-21 VITALS — BP 118/80 | HR 84 | Temp 97.4°F | Resp 18 | Ht 68.0 in | Wt 144.4 lb

## 2019-11-21 DIAGNOSIS — R809 Proteinuria, unspecified: Secondary | ICD-10-CM | POA: Diagnosis not present

## 2019-11-21 DIAGNOSIS — Z09 Encounter for follow-up examination after completed treatment for conditions other than malignant neoplasm: Secondary | ICD-10-CM

## 2019-11-21 LAB — URINALYSIS, ROUTINE W REFLEX MICROSCOPIC
Bilirubin Urine: NEGATIVE
Hgb urine dipstick: NEGATIVE
Ketones, ur: NEGATIVE
Leukocytes,Ua: NEGATIVE
Nitrite: NEGATIVE
RBC / HPF: NONE SEEN (ref 0–?)
Specific Gravity, Urine: <=1.005 — AB (ref 1.000–1.030)
Total Protein, Urine: NEGATIVE
Urine Glucose: NEGATIVE
Urobilinogen, UA: 0.2 (ref 0.0–1.0)
WBC, UA: NONE SEEN (ref 0–?)
pH: 7 (ref 5.0–8.0)

## 2019-11-21 LAB — POC URINALSYSI DIPSTICK (AUTOMATED)
Bilirubin, UA: NEGATIVE
Blood, UA: NEGATIVE
Glucose, UA: NEGATIVE
Ketones, UA: NEGATIVE
Leukocytes, UA: NEGATIVE
Nitrite, UA: NEGATIVE
Protein, UA: NEGATIVE
Spec Grav, UA: <=1.005 — AB (ref 1.010–1.025)
Urobilinogen, UA: 0.2 E.U./dL
pH, UA: 6 (ref 5.0–8.0)

## 2019-11-21 LAB — COMPREHENSIVE METABOLIC PANEL
ALT: 11 U/L (ref 0–35)
AST: 16 U/L (ref 0–37)
Albumin: 4.1 g/dL (ref 3.5–5.2)
Alkaline Phosphatase: 54 U/L (ref 39–117)
BUN: 7 mg/dL (ref 6–23)
CO2: 27 mEq/L (ref 19–32)
Calcium: 9 mg/dL (ref 8.4–10.5)
Chloride: 101 mEq/L (ref 96–112)
Creatinine, Ser: 0.88 mg/dL (ref 0.40–1.20)
GFR: 81.37 mL/min (ref >60.00)
Glucose, Bld: 79 mg/dL (ref 70–99)
Potassium: 4.2 mEq/L (ref 3.5–5.1)
Sodium: 135 mEq/L (ref 135–145)
Total Bilirubin: 0.4 mg/dL (ref 0.2–1.2)
Total Protein: 6.2 g/dL (ref 6.0–8.3)

## 2019-11-21 NOTE — Patient Instructions (Addendum)
You had a lot of protein yesterday at gyn office but none today. Will send your urine out for microscopic study and get cmp to check kidney function. Will follow results. Repeat urine poct in 2 weeks.   No heavy exercise 3 days prior to repeat. Make sure well hydrated as well.  Schedule follow up urine test when we update you on above labs.

## 2019-11-21 NOTE — Progress Notes (Signed)
Subjective:    Patient ID: Lauren Coleman, female    DOB: 21-May-2001, 21 y.o.   MRN: [id number]  HPI  Pt in for evaluation.   Pt states at gyn office visit had ua. She had some  protein in her urine.  Pt had urine result yesterday which showed protein of 2000+ on review of epic labs.  No recent heavy exercise. Pt was not fasting.  Pt has no signs or symptoms. States feels fine.  No hx of diabetes, htn or family hx kidney disease.   Review of Systems  Constitutional: Negative for chills and fatigue.  Respiratory: Negative for cough, chest tightness and wheezing.   Cardiovascular: Negative for chest pain and palpitations.  Gastrointestinal: Negative for abdominal pain, constipation, diarrhea, nausea and vomiting.  Endocrine: Negative for polydipsia, polyphagia and polyuria.  Genitourinary: Negative for difficulty urinating and dysuria.  Skin: Negative for rash.   Past Medical History:  Diagnosis Date  . Acne   . HSV-1 infection   . Medical history non-contributory      Social History   Socioeconomic History  . Marital status: Single    Spouse name: Not on file  . Number of children: Not on file  . Years of education: Not on file  . Highest education level: Not on file  Occupational History  . Not on file  Tobacco Use  . Smoking status: Never Smoker  . Smokeless tobacco: Never Used  Substance and Sexual Activity  . Alcohol use: No    Alcohol/week: 0.0 standard drinks  . Drug use: No  . Sexual activity: Yes    Birth control/protection: Pill  Other Topics Concern  . 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 Difficulty of Paying Living Expenses:   Food Insecurity:   . Worried About Charity fundraiser in the Last Year:   . Arboriculturist in the Last Year:   Transportation  Needs:   . Film/video editor (Medical):   Marland Kitchen Lack of Transportation (Non-Medical):   Physical Activity:   . Days of Exercise per Week:   . Minutes of Exercise per Session:   Stress:   . Feeling of Stress :   Social Connections:   . Frequency of Communication with Friends and Family:   . Frequency of Social Gatherings with Friends and Family:   . Attends Religious Services:   . Active Member of Clubs or Organizations:   . Attends Archivist Meetings:   Marland Kitchen Marital Status:   Intimate Partner Violence:   . Fear of Current or Ex-Partner:   . Emotionally Abused:   Marland Kitchen Physically Abused:   . Sexually Abused:     Past Surgical History:  Procedure Laterality Date  . TONSILLECTOMY      Family History  Problem Relation Age of Onset  . Colitis Mother   . Sarcoidosis Mother   . Hypertension Father   . Hyperlipidemia Father   . Anxiety disorder Father   . Ovarian cancer Maternal Grandmother   . Hypertension Maternal Grandmother   . Hypothyroidism Maternal Grandmother   . Brain cancer Maternal Grandfather   . Arthritis Maternal Grandfather   . Hyperlipidemia Paternal Grandmother   . Hypertension Paternal Grandmother   .  Diabetes Paternal Grandfather   . Stroke Paternal Grandfather   . Colon cancer Maternal Uncle   . Breast cancer Maternal Aunt   . Healthy Paternal Uncle     No Known Allergies  Current Outpatient Medications on File Prior to Visit  Medication Sig Dispense Refill  . azithromycin (ZITHROMAX) 250 MG tablet Take 2 tabs the first day and then 1 tab daily until you run out. (Patient not taking: Reported on 10/30/2019) 6 tablet 0  . benzonatate (TESSALON) 100 MG capsule Take 1 capsule (100 mg total) by mouth 3 (three) times daily as needed for cough. (Patient not taking: Reported on 10/30/2019) 30 capsule 0  . doxycycline (VIBRA-TABS) 100 MG tablet Take 1 tablet (100 mg total) by mouth 2 (two) times daily. Can give caps or generic (Patient not taking: Reported  on 10/30/2019) 14 tablet 0  . fluticasone (FLONASE) 50 MCG/ACT nasal spray Place 2 sprays into both nostrils daily. (Patient not taking: Reported on 10/30/2019) 16 g 1  . predniSONE (STERAPRED UNI-PAK 21 TAB) 10 MG (21) TBPK tablet As directed (Patient not taking: Reported on 10/30/2019) 21 tablet 0  . sertraline (ZOLOFT) 25 MG tablet Take 1 tablet (25 mg total) by mouth daily. (Patient not taking: Reported on 10/30/2019) 30 tablet 0  . SPRINTEC 28 0.25-35 MG-MCG tablet   11  . valACYclovir (VALTREX) 1000 MG tablet Take 2 tablets (2,000 mg total) by mouth 2 (two) times daily. For 1 day at first sign of outbreak (Patient not taking: Reported on 10/30/2019) 30 tablet 2   No current facility-administered medications on file prior to visit.    BP 118/80 (BP Location: Right Arm, Patient Position: Sitting, Cuff Size: Normal)   Pulse 84   Temp (!) 97.4 F (36.3 C) (Temporal)   Resp 18   Ht 5\' 8"  (1.727 m)   Wt 144 lb 6.4 oz (65.5 kg)   LMP 11/03/2019 (Approximate)   SpO2 97%   BMI 21.96 kg/m       Objective:   Physical Exam  General- No acute distress. Pleasant patient. Neck- Full range of motion, no jvd Lungs- Clear, even and unlabored. Heart- regular rate and rhythm. Neurologic- CNII- XII grossly intact. Abdomen- soft, nd, nt, +bs, no rebound or guarding. Back no cva tenderness.      Assessment & Plan:  You had a lot of protein yesterday at gyn office but none today. Will send your urine out for microscopic study and get cmp to check kidney function. Will follow results. Repeat urine poct in 2 weeks.   No heavy exercise 3 days prior to repeat. Make sure well hydrated as well.  Schedule follow up urine test when we update you on above labs.  11/05/2019, PA-C   Time spent with patient today was 25  minutes which consisted of chart review, discussing diagnosis, work up going forward and documentation.

## 2019-11-22 ENCOUNTER — Telehealth: Payer: Self-pay | Admitting: Medical

## 2019-11-22 DIAGNOSIS — R809 Proteinuria, unspecified: Secondary | ICD-10-CM

## 2019-11-22 NOTE — Telephone Encounter (Signed)
Future urine order placed. °

## 2019-12-03 ENCOUNTER — Other Ambulatory Visit: Payer: Self-pay

## 2019-12-05 ENCOUNTER — Other Ambulatory Visit: Payer: Self-pay

## 2019-12-05 ENCOUNTER — Ambulatory Visit: Payer: BC Managed Care – PPO | Admitting: Medical

## 2019-12-05 VITALS — BP 127/80 | HR 95 | Temp 97.9°F | Resp 18 | Ht 68.0 in | Wt 145.6 lb

## 2019-12-05 DIAGNOSIS — J301 Allergic rhinitis due to pollen: Secondary | ICD-10-CM | POA: Diagnosis not present

## 2019-12-05 DIAGNOSIS — R809 Proteinuria, unspecified: Secondary | ICD-10-CM

## 2019-12-05 LAB — URINALYSIS, ROUTINE W REFLEX MICROSCOPIC
Bilirubin Urine: NEGATIVE
Ketones, ur: NEGATIVE
Leukocytes,Ua: NEGATIVE
Nitrite: NEGATIVE
Specific Gravity, Urine: <=1.005 — AB (ref 1.000–1.030)
Total Protein, Urine: NEGATIVE
Urine Glucose: NEGATIVE
Urobilinogen, UA: 0.2 (ref 0.0–1.0)
pH: 6 (ref 5.0–8.0)

## 2019-12-05 MED ORDER — FLUTICASONE PROPIONATE 50 MCG/ACT NA SUSP: 2.0000 | Freq: Every day | NASAL | 1 refills | Status: AC

## 2019-12-05 MED ORDER — LEVOCETIRIZINE DIHYDROCHLORIDE 5 MG PO TABS: 5.0000 mg | ORAL_TABLET | Freq: Every evening | ORAL | 3 refills | Status: AC

## 2019-12-05 NOTE — Progress Notes (Signed)
Subjective:    Patient ID: Lauren Coleman, female    DOB: 2001-01-01, 19 y.o.   MRN: [id number]  HPI  Pt in for follow up.  Pt states she has been feeling well.  Pt has stuffy nose recently for about 2 weeks. Worse in morning and better later in the day. No sneezing. When blows her nose mild green dc. No sinus pain. No teeth pain. No fever, no chills, no sweats and no body aches. Pt can still smell and taste. Pt does have some allergies this time of year. 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 Pt not wearing mask when outside.  Pt had hx of some protein in urine at gyn office. But in office ua poct negative for protein. Send out with reflex microscopy was negative for protein. Kidney function looked good as well.    Review of Systems  Constitutional: Negative for chills, fatigue and fever.  HENT: Positive for congestion and postnasal drip. Negative for nosebleeds, sneezing and sore throat.   Respiratory: Negative for cough, chest tightness, shortness of breath and wheezing.   Cardiovascular: Negative for chest pain and palpitations.  Gastrointestinal: Negative for abdominal pain and blood in stool.  Musculoskeletal: Negative for back pain.  Skin: Negative for rash.  Neurological: Negative for dizziness, speech difficulty, weakness, numbness and headaches.  Hematological: Negative for adenopathy. Does not bruise/bleed easily.  Psychiatric/Behavioral: Negative for behavioral problems and confusion.    Past Medical History:  Diagnosis Date  . Acne   . HSV-1 infection   . Medical history non-contributory      Social History   Socioeconomic History  . Marital status: Single    Spouse name: Not on file  . Number of children: Not on file  . Years of education: Not on file  . Highest education level: Not on file  Occupational History  . Not on file  Tobacco Use  . Smoking status: Never Smoker  . Smokeless tobacco: Never Used  Substance and Sexual  Activity  . Alcohol use: No    Alcohol/week: 0.0 standard drinks  . Drug use: No  . Sexual activity: Yes    Birth control/protection: Pill  Other Topics Concern  . Not on file  Social History Narrative   Lives with parents      Going to [facility] Fall 2019 - plan if for 2 years and then [place] - wants to work in Patent examiner   Social Determinants of Corporate investment banker Strain:   . Difficulty of Paying Living Expenses:   Food Insecurity:   . Worried About Programme researcher, broadcasting/film/video in the Last Year:   . Barista in the Last Year:   Transportation Needs:   . Freight forwarder (Medical):   Marland Kitchen Lack of Transportation (Non-Medical):   Physical Activity:   . Days of Exercise per Week:   . Minutes of Exercise per Session:   Stress:   . Feeling of Stress :   Social Connections:   . Frequency of Communication with Friends and Family:   . Frequency of Social Gatherings with Friends and Family:   . Attends Religious Services:   . Active Member of Clubs or Organizations:   . Attends Banker Meetings:   Marland Kitchen Marital Status:   Intimate Partner Violence:   . Fear of Current or Ex-Partner:   . Emotionally Abused:   Marland Kitchen Physically Abused:   . Sexually Abused:  Past Surgical History:  Procedure Laterality Date  . TONSILLECTOMY      Family History  Problem Relation Age of Onset  . Colitis Mother   . Sarcoidosis Mother   . Hypertension Father   . Hyperlipidemia Father   . Anxiety disorder Father   . Ovarian cancer Maternal Grandmother   . Hypertension Maternal Grandmother   . Hypothyroidism Maternal Grandmother   . Brain cancer Maternal Grandfather   . Arthritis Maternal Grandfather   . Hyperlipidemia Paternal Grandmother   . Hypertension Paternal Grandmother   . Diabetes Paternal Grandfather   . Stroke Paternal Grandfather   . Colon cancer Maternal Uncle   . Breast cancer Maternal Aunt   . Healthy Paternal Uncle     No Known Allergies  Current  Outpatient Medications on File Prior to Visit  Medication Sig Dispense Refill  . azithromycin (ZITHROMAX) 250 MG tablet Take 2 tabs the first day and then 1 tab daily until you run out. (Patient not taking: Reported on 10/30/2019) 6 tablet 0  . benzonatate (TESSALON) 100 MG capsule Take 1 capsule (100 mg total) by mouth 3 (three) times daily as needed for cough. (Patient not taking: Reported on 10/30/2019) 30 capsule 0  . doxycycline (VIBRA-TABS) 100 MG tablet Take 1 tablet (100 mg total) by mouth 2 (two) times daily. Can give caps or generic (Patient not taking: Reported on 10/30/2019) 14 tablet 0  . predniSONE (STERAPRED UNI-PAK 21 TAB) 10 MG (21) TBPK tablet As directed (Patient not taking: Reported on 10/30/2019) 21 tablet 0  . sertraline (ZOLOFT) 25 MG tablet Take 1 tablet (25 mg total) by mouth daily. (Patient not taking: Reported on 10/30/2019) 30 tablet 0  . SPRINTEC 28 0.25-35 MG-MCG tablet   11  . valACYclovir (VALTREX) 1000 MG tablet Take 2 tablets (2,000 mg total) by mouth 2 (two) times daily. For 1 day at first sign of outbreak (Patient not taking: Reported on 10/30/2019) 30 tablet 2   No current facility-administered medications on file prior to visit.    BP 127/80 (BP Location: Left Arm, Patient Position: Sitting, Cuff Size: Normal)   Pulse 95   Temp 97.9 F (36.6 C) (Temporal)   Resp 18   Ht 5\' 8"  (1.727 m)   Wt 145 lb 9.6 oz (66 kg)   LMP 12/05/2019   SpO2 100%   BMI 22.14 kg/m       Objective:   Physical Exam  General  Mental Status - Alert. General Appearance - Well groomed. Not in acute distress.  Skin Rashes- No Rashes.  HEENT Head- Normal.  Nose & Sinuses Nasal Mucosa- Left-  Boggy and Congested. Right-  Boggy and  Congested.Bilateral maxillary and frontal sinus pressure. Mouth & Throat Lips: Upper Lip- Normal: no dryness, cracking, pallor, cyanosis, or vesicular eruption. Lower Lip-Normal: no dryness, cracking, pallor, cyanosis or vesicular eruption. Buccal  Mucosa- Bilateral- No Aphthous ulcers. Oropharynx- No Discharge or Erythema. Tonsils: Characteristics- Bilateral- No Erythema or Congestion. Size/Enlargement- Bilateral- No enlargement. Discharge- bilateral-None.  Neck Neck- Supple. No Masses.   Chest and Lung Exam Auscultation: Breath Sounds:-Clear even and unlabored.  Cardiovascular Auscultation:Rythm- Regular, rate and rhythm. Murmurs & Other Heart Sounds:Ausculatation of the heart reveal- No Murmurs.  Lymphatic Head & Neck General Head & Neck Lymphatics: Bilateral: Description- No Localized lymphadenopathy.  Back- no cva tenderness.     Assessment & Plan:  You appear to have allergic rhinitis will rx xyzal and flonase. If colored mucus persists or you develop sinus pressure will  rx antibiotic but want to hold off further to see if all signs/symptosm clear with allergy tx.  For hx of proteinuria will get repeat ua with reflex microscopy. After review may refer you to nephrologist.   Follow up as needed  Time spent with patient today was 25  minutes which consisted of chart review, discussing diagnosis, work up treatment and documentation.  Mackie Pai, PA-C

## 2019-12-05 NOTE — Patient Instructions (Signed)
You appear to have allergic rhinitis will rx xyzal and flonase. If colored mucus persists or you develop sinus pressure will rx antibiotic but want to hold off further to see if all signs/symptosm clear with allergy tx.  For hx of proteinuria will get repeat ua with reflex microscopy. After review may refer you to nephrologist.   Follow up as needed

## 2019-12-08 ENCOUNTER — Encounter: Payer: Self-pay | Admitting: Medical

## 2019-12-08 ENCOUNTER — Telehealth: Payer: Self-pay | Admitting: Medical

## 2019-12-08 ENCOUNTER — Telehealth: Payer: Self-pay

## 2019-12-08 DIAGNOSIS — R823 Hemoglobinuria: Secondary | ICD-10-CM

## 2019-12-08 DIAGNOSIS — R809 Proteinuria, unspecified: Secondary | ICD-10-CM

## 2019-12-08 NOTE — Telephone Encounter (Signed)
Future urinalysis placed. Will you help get scheduled just for the lab. Make sure not around menses.

## 2019-12-08 NOTE — Telephone Encounter (Signed)
Patient called in to see if PA edward Saguier could put in another order for lab work. Per the patient the results that came back she feels it was inaccurate because she was on her monthly cycle at the time of lab work. Please give the patient a cll to advise at [phone]

## 2019-12-08 NOTE — Telephone Encounter (Signed)
Patient will come in next Tuesday after her menstrual is off

## 2019-12-08 NOTE — Telephone Encounter (Signed)
Referral to nephrologist placed. 

## 2019-12-08 NOTE — Telephone Encounter (Signed)
Future lab urinalysis only(make sure not on menses when sample given). No appointment.

## 2020-01-14 ENCOUNTER — Telehealth (INDEPENDENT_AMBULATORY_CARE_PROVIDER_SITE_OTHER): Payer: BC Managed Care – PPO | Admitting: Family Medicine

## 2020-01-14 ENCOUNTER — Ambulatory Visit: Payer: BC Managed Care – PPO | Admitting: Family Medicine

## 2020-01-14 ENCOUNTER — Other Ambulatory Visit: Payer: Self-pay

## 2020-01-14 ENCOUNTER — Ambulatory Visit: Payer: BC Managed Care – PPO | Admitting: Medical

## 2020-01-14 ENCOUNTER — Encounter: Payer: Self-pay | Admitting: Family Medicine

## 2020-01-14 DIAGNOSIS — F418 Other specified anxiety disorders: Secondary | ICD-10-CM | POA: Diagnosis not present

## 2020-01-14 MED ORDER — ESCITALOPRAM OXALATE 10 MG PO TABS: 10.0000 mg | ORAL_TABLET | Freq: Every day | ORAL | 2 refills | Status: AC
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
## 2020-02-05 ENCOUNTER — Encounter: Payer: Self-pay | Admitting: Medical

## 2020-02-09 ENCOUNTER — Ambulatory Visit: Payer: BC Managed Care – PPO | Admitting: Medical

## 2021-11-09 ENCOUNTER — Telehealth: Payer: Self-pay

## 2021-11-09 NOTE — Telephone Encounter (Signed)
Opened in error

## 2022-02-21 ENCOUNTER — Ambulatory Visit (INDEPENDENT_AMBULATORY_CARE_PROVIDER_SITE_OTHER): Payer: Commercial Indemnity | Admitting: Family

## 2022-02-21 ENCOUNTER — Encounter: Payer: Self-pay | Admitting: Family

## 2022-02-21 VITALS — BP 120/80 | HR 104 | Temp 97.9°F | Resp 18 | Ht 68.0 in | Wt 150.4 lb

## 2022-02-21 DIAGNOSIS — R3129 Other microscopic hematuria: Secondary | ICD-10-CM

## 2022-02-21 DIAGNOSIS — R350 Frequency of micturition: Secondary | ICD-10-CM | POA: Diagnosis not present

## 2022-02-21 DIAGNOSIS — N390 Urinary tract infection, site not specified: Secondary | ICD-10-CM

## 2022-02-21 LAB — POC URINALSYSI DIPSTICK (AUTOMATED)
Glucose, UA: NEGATIVE
Ketones, UA: NEGATIVE
Nitrite, UA: POSITIVE
Protein, UA: NEGATIVE
Spec Grav, UA: <=1.005 — AB (ref 1.010–1.025)
Urobilinogen, UA: 0.2 E.U./dL
pH, UA: 5 (ref 5.0–8.0)

## 2022-02-21 MED ORDER — NITROFURANTOIN MONOHYD MACRO 100 MG PO CAPS: 100.0000 mg | ORAL_CAPSULE | Freq: Two times a day (BID) | ORAL | 0 refills | Status: AC

## 2022-02-21 NOTE — Assessment & Plan Note (Signed)
Will send urine for culture.  Start empiric macrobid. Given recent recurrent nature of her infections, will refer to Urology for consultation.  We discussed importance of proper rest/hygiene. I think that she has become run down these last few months which is contributing to her infections.

## 2022-02-21 NOTE — Progress Notes (Addendum)
Subjective:   By signing my name below, I, Vickey Sages, attest that this documentation has been prepared under the direction and in the presence of Sandford Craze, NP 02/21/2022.   Patient ID: Lauren Coleman, female    DOB: May 31, 2001, 21 y.o.   MRN: [id number]  Chief Complaint  Patient presents with   Urinary Frequency    Pt states having freq UTIs since the beginning of the year and been treated but the nurse at work. Pt states having freq urine and burning. Pt states she has taken Azo recently.     HPI Patient is in today for an office visit.  She complains that her immune system is not functioning as well as it should be.   UTI- She complains of her frequent UTI's and reports that she has had 4 this year. She reports that symptoms for her current UTI began last night. She has been seeing a Publishing rights manager in her city of [place] who has been prescribing BACTRIM each time. She does not think that her urine has been cultured, just dipped. She reports that this has been a temporary solution as the UTI's are recurring. She is seeking alternative treatment that will help better manage her frequent UTI's.  Intercourse- She has been having sexual intercourse regularly with her husband and has been taking precautions, such as taking probiotics and asking her husband to shower before and after intercourse, as well as herself.  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  Past Medical History:  Diagnosis Date   Acne    HSV-1 infection    Medical history non-contributory     Past Surgical History:  Procedure Laterality Date   TONSILLECTOMY      Family History  Problem Relation Age of Onset   Colitis Mother    Sarcoidosis Mother    Hypertension Father    Hyperlipidemia Father    Anxiety disorder Father    Ovarian cancer Maternal Grandmother    Hypertension Maternal Grandmother    Hypothyroidism  Maternal Grandmother    Brain cancer Maternal Grandfather    Arthritis Maternal Grandfather    Hyperlipidemia Paternal Grandmother    Hypertension Paternal Grandmother    Diabetes Paternal Grandfather    Stroke Paternal Grandfather    Colon cancer Maternal Uncle    Breast cancer Maternal Aunt    Healthy Paternal Uncle     Social History   Socioeconomic History   Marital status: Single    Spouse name: Not on file   Number of children: Not on file   Years of education: Not on file   Highest education level: Not on file  Occupational History   Not on file  Tobacco Use   Smoking status: Never   Smokeless tobacco: Never  Substance and Sexual Activity   Alcohol use: No    Alcohol/week: 0.0 standard drinks of alcohol   Drug use: No   Sexual activity: Yes    Birth control/protection: Pill  Other Topics Concern   Not on file  Social History Narrative   Lives with parents      Going to [employer] [facility] [employer] [facility] Fall 2019 - plan if for 2 years and then [place] - 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 30 tablet 2   fluticasone (FLONASE) 50 MCG/ACT nasal spray Place 2 sprays into both nostrils daily. 16 g 1   levocetirizine (XYZAL) 5 MG tablet Take 1 tablet (5 mg total) by mouth every evening. 30 tablet 3   SPRINTEC 28 0.25-35 MG-MCG tablet   11   azithromycin (ZITHROMAX) 250 MG tablet Take 2 tabs the first day and then 1 tab daily until you run out. (Patient not taking: Reported on 10/30/2019) 6 tablet 0   benzonatate (TESSALON) 100 MG capsule Take 1 capsule (100 mg total) by mouth 3  (three) times daily as needed for cough. (Patient not taking: Reported on 10/30/2019) 30 capsule 0   doxycycline (VIBRA-TABS) 100 MG tablet Take 1 tablet (100 mg total) by mouth 2 (two) times daily. Can give caps or generic (Patient not taking: Reported on 10/30/2019) 14 tablet 0   predniSONE (STERAPRED UNI-PAK 21 TAB) 10 MG (21) TBPK tablet As directed (Patient not taking: Reported on 10/30/2019) 21 tablet 0   valACYclovir (VALTREX) 1000 MG tablet Take 2 tablets (2,000 mg total) by mouth 2 (two) times daily. For 1 day at first sign of outbreak (Patient not taking: Reported on 10/30/2019) 30 tablet 2   No facility-administered medications prior to visit.    No Known Allergies  Review of Systems  Genitourinary:        (+) UTI (+) Burning       Objective:    Physical Exam Constitutional:      General: She is not in acute distress.    Appearance: Normal appearance. She is not ill-appearing.  HENT:     Head: Normocephalic and atraumatic.     Right Ear: External ear normal.     Left Ear: External ear normal.  Eyes:     Extraocular Movements: Extraocular movements intact.     Pupils: Pupils are equal, round, and reactive to light.  Cardiovascular:     Rate and Rhythm: Normal rate and regular rhythm.     Heart sounds: Normal heart sounds. No murmur heard.    No gallop.  Pulmonary:     Effort: Pulmonary effort is normal. No respiratory distress.     Breath sounds: Normal breath sounds. No wheezing or rales.  Abdominal:     Tenderness: There is no right CVA tenderness or left CVA tenderness.  Skin:    General: Skin is warm and dry.  Neurological:     Mental Status: She is alert and oriented to person, place, and time.  Psychiatric:        Mood and Affect: Mood normal.        Behavior: Behavior normal.        Judgment: Judgment normal.     BP 120/80 (BP Location: Left Arm, Patient Position: Sitting, Cuff Size: Normal)   Pulse (!) 104   Temp 97.9 F (36.6 C) (Oral)   Resp 18    Ht 5\' 8"  (1.727 m)   Wt 150 lb 6.4 oz (68.2 kg)   SpO2 99%   BMI 22.87 kg/m  Wt Readings from Last 3 Encounters:  02/21/22 150 lb 6.4 oz (68.2 kg)  12/05/19 145 lb 9.6 oz (66 kg)  11/21/19 144 lb 6.4 oz (65.5 kg)  Assessment & Plan:   Problem List Items Addressed This Visit       Unprioritized   Recurrent UTI    Will send urine for culture.  Start empiric macrobid. Given recent recurrent nature of her infections, will refer to Urology for consultation.  We discussed importance of proper rest/hygiene. I think that she has become run down these last few months which is contributing to her infections.       Relevant Medications   nitrofurantoin, macrocrystal-monohydrate, (MACROBID) 100 MG capsule   Other Relevant Orders   Ambulatory referral to Urology   Other Visit Diagnoses     Urinary frequency    -  Primary   Relevant Orders   POCT Urinalysis Dipstick (Automated) (Completed)   Microscopic hematuria       Relevant Orders   Urine Culture       Meds ordered this encounter  Medications   nitrofurantoin, macrocrystal-monohydrate, (MACROBID) 100 MG capsule    Sig: Take 1 capsule (100 mg total) by mouth 2 (two) times daily.    Dispense:  10 capsule    Refill:  0    Order Specific Question:   Supervising Provider    Answer:   Penni Homans A [[id number]]    I, Nance Pear, NP, personally preformed the services described in this documentation.  All medical record entries made by the scribe were at my direction and in my presence.  I have reviewed the chart and discharge instructions (if applicable) and agree that the record reflects my personal performance and is accurate and complete. 02/21/2022.  I,Mohammed Iqbal,acting as a Education administrator for [employer], NP.,have documented all relevant documentation on the behalf of Nance Pear, NP,as directed by  Nance Pear, NP while in the presence of Nance Pear, NP.  Nance Pear, NP

## 2022-02-25 LAB — URINE CULTURE
MICRO NUMBER:: 13631007
SPECIMEN QUALITY:: ADEQUATE

## 2022-10-05 ENCOUNTER — Encounter: Payer: Self-pay | Admitting: Family Medicine

## 2022-10-05 ENCOUNTER — Ambulatory Visit: Payer: Managed Care, Other (non HMO) | Admitting: Family Medicine

## 2022-10-05 VITALS — BP 126/83 | HR 83 | Temp 97.6°F | Resp 16 | Ht 68.0 in | Wt 156.4 lb

## 2022-10-05 DIAGNOSIS — N3001 Acute cystitis with hematuria: Secondary | ICD-10-CM

## 2022-10-05 LAB — POC URINALSYSI DIPSTICK (AUTOMATED)
Bilirubin, UA: NEGATIVE
Glucose, UA: NEGATIVE
Ketones, UA: NEGATIVE
Nitrite, UA: POSITIVE
Protein, UA: NEGATIVE
Spec Grav, UA: <=1.005 — AB (ref 1.010–1.025)
Urobilinogen, UA: 0.2 E.U./dL
pH, UA: 6 (ref 5.0–8.0)

## 2022-10-05 MED ORDER — PHENAZOPYRIDINE HCL 200 MG PO TABS: 200.0000 mg | ORAL_TABLET | Freq: Three times a day (TID) | ORAL | 0 refills | Status: AC | PRN

## 2022-10-05 MED ORDER — SULFAMETHOXAZOLE-TRIMETHOPRIM 800-160 MG PO TABS: 1.0000 | ORAL_TABLET | Freq: Two times a day (BID) | ORAL | 0 refills | Status: AC

## 2022-10-05 NOTE — Patient Instructions (Signed)
Urine dipstick with large leukocytes, blood, and nitrites. Will send for culture which will take about 2 days to result. Since it won't come back until the weekend, I will go ahead and treat you with an antibiotic and if we have to make any changes based on the culture, we will let you know.   Increase fluid intake to a minimum of 8, 8 ounce glasses of water per day to help flush the kidneys and bladder.   The medication Pyridium may help with symptoms of bladder spasms, abdominal pain, and burning. This medication will turn your urine bright orange. This is expected and nothing to be concerned about. I have prescribed this for you in addition to the antibiotic.   If you begin to have worsening symptoms, low back pain, fevers, chills, nausea, vomiting, diarrhea, or decreased urine, please let us know.  Please finish all of your antibiotic even if you begin to feel better before you have completed the full course. It takes the full dose to be completely effective against the bacteria present. Stopping the antibiotic early can lead to the bacteria becoming resistant to the antibiotic and puts you at risk of the antibiotic not working for you in the future.

## 2022-10-05 NOTE — Progress Notes (Signed)
Acute Office Visit  Subjective:     Patient ID: Lauren Coleman, female    DOB: 07-Apr-2001, 22 y.o.   MRN: [id number]  Chief Complaint  Patient presents with   Dysuria    Here for sensation of pain and burning  when urinating.       Urinary Tract Infection: Patient complains of burning with urination, frequency, suprapubic pressure, and urgency She has had symptoms for 1 day. Patient denies back pain, cough, fever, and vaginal discharge. Patient does not have a history of recurrent UTI.  Patient does not have a history of pyelonephritis.  She has taken some Azo today.     All review of systems negative except what is listed in the HPI      Objective:    BP 126/83 (BP Location: Right Arm, Patient Position: Sitting, Cuff Size: Normal)   Pulse 83   Temp 97.6 F (36.4 C) (Oral)   Resp 16   Ht 5' 8"$  (1.727 m)   Wt 156 lb 6.4 oz (70.9 kg)   SpO2 98%   BMI 23.78 kg/m    Physical Exam Vitals reviewed.  Constitutional:      General: She is not in acute distress.    Appearance: Normal appearance. She is not ill-appearing.  Abdominal:     General: Abdomen is flat. There is no distension.     Palpations: Abdomen is soft. There is no mass.     Tenderness: There is no abdominal tenderness. There is no right CVA tenderness, left CVA tenderness, guarding or rebound.  Skin:    General: Skin is warm and dry.  Neurological:     General: No focal deficit present.     Mental Status: She is alert and oriented to person, place, and time. Mental status is at baseline.  Psychiatric:        Mood and Affect: Mood normal.        Behavior: Behavior normal.        Thought Content: Thought content normal.        Judgment: Judgment normal.        Results for orders placed or performed in visit on 10/05/22  POCT Urinalysis Dipstick (Automated)  Result Value Ref Range   Color, UA yellow    Clarity, UA clear    Glucose, UA Negative Negative   Bilirubin, UA negative    Ketones, UA  negative    Spec Grav, UA <=1.005 (A) 1.010 - 1.025   Blood, UA large    pH, UA 6.0 5.0 - 8.0   Protein, UA Negative Negative   Urobilinogen, UA 0.2 0.2 or 1.0 E.U./dL   Nitrite, UA positive    Leukocytes, UA Large (3+) (A) Negative        Assessment & Plan:   Problem List Items Addressed This Visit   None Visit Diagnoses     Acute cystitis with hematuria    -  Primary   Relevant Medications   sulfamethoxazole-trimethoprim (BACTRIM DS) 800-160 MG tablet   phenazopyridine (PYRIDIUM) 200 MG tablet   Other Relevant Orders   POCT Urinalysis Dipstick (Automated) (Completed)   Urine Culture     Urine dipstick with large leukocytes, blood, and nitrites. Will send for culture which will take about 2 days to result. Since it won't come back until the weekend, I will go ahead and treat you with an antibiotic and if we have to make any changes based on the culture, we will let you know.  Increase fluid intake to a minimum of 8, 8 ounce glasses of water per day to help flush the kidneys and bladder.   The medication Pyridium may help with symptoms of bladder spasms, abdominal pain, and burning. This medication will turn your urine bright orange. This is expected and nothing to be concerned about. I have prescribed this for you in addition to the antibiotic.   If you begin to have worsening symptoms, low back pain, fevers, chills, nausea, vomiting, diarrhea, or decreased urine, please let us know.  Please finish all of your antibiotic even if you begin to feel better before you have completed the full course. It takes the full dose to be completely effective against the bacteria present. Stopping the antibiotic early can lead to the bacteria becoming resistant to the antibiotic and puts you at risk of the antibiotic not working for you in the future.  Meds ordered this encounter  Medications   sulfamethoxazole-trimethoprim (BACTRIM DS) 800-160 MG tablet    Sig: Take 1 tablet by mouth 2  (two) times daily for 3 days.    Dispense:  6 tablet    Refill:  0    Order Specific Question:   Supervising Provider    Answer:   Penni Homans A [[id number]]   phenazopyridine (PYRIDIUM) 200 MG tablet    Sig: Take 1 tablet (200 mg total) by mouth 3 (three) times daily as needed for pain.    Dispense:  10 tablet    Refill:  0    Order Specific Question:   Supervising Provider    Answer:   Penni Homans A [[id number]]    Return if symptoms worsen or fail to improve.  Terrilyn Saver, NP

## 2022-10-06 LAB — URINE CULTURE
MICRO NUMBER:: 14601212
Result:: NO GROWTH
SPECIMEN QUALITY:: ADEQUATE

## 2022-10-07 ENCOUNTER — Emergency Department (HOSPITAL_BASED_OUTPATIENT_CLINIC_OR_DEPARTMENT_OTHER)
Admission: EM | Admit: 2022-10-07 | Discharge: 2022-10-07 | Disposition: A | Discharge status: Home / Self Care | Payer: Managed Care, Other (non HMO) | Attending: Emergency Medicine | Admitting: Emergency Medicine

## 2022-10-07 ENCOUNTER — Encounter (HOSPITAL_BASED_OUTPATIENT_CLINIC_OR_DEPARTMENT_OTHER): Payer: Self-pay | Admitting: Emergency Medicine

## 2022-10-07 ENCOUNTER — Other Ambulatory Visit: Payer: Self-pay

## 2022-10-07 DIAGNOSIS — N3001 Acute cystitis with hematuria: Secondary | ICD-10-CM | POA: Diagnosis not present

## 2022-10-07 DIAGNOSIS — R109 Unspecified abdominal pain: Secondary | ICD-10-CM | POA: Diagnosis present

## 2022-10-07 NOTE — ED Triage Notes (Addendum)
Pt c/o concerned about WBC in urine specimen recently after using google. Pt seen by PCP for UTI symptoms on Thursdayand was given antibiotics to treat. Pt has history of recurrent UTI.

## 2022-10-07 NOTE — ED Provider Notes (Signed)
[facility] Provider Note   CSN: [id number] Arrival date & time: 10/07/22  1132     History  Chief Complaint  Patient presents with   Abdominal Pain    Lauren Coleman is a 22 y.o. female.  22 y.o female with a PMH of recurrent UTI presents to the ED with concern for no growth on urine culture.  Patient reports she has been treated for urinary tract infections every 3 months, she reports seeing her PCP two days ago and she was prescribed Bactrim in order to help treat a UTI.  Patient is concerned as today she checked MyChart, noticed that the urine culture had no growth on it, she is unsure why she is getting recurrent UTIs.  She reports taking all sorts of measurements such as pain after sex, given a prophylactic antibiotic such as Macrobid which she reports she has not been taking.  She is concerned that she has a recurrent history of UTIs and does not know why these are happening.  Mother at the bedside reports patient does feel anxious about the results and did not know if she would benefit for further imaging. No fever, no nausea, vomiting or other complaints. Does report symptoms are improving since starting antibiotic.   The history is provided by the patient and medical records.  Abdominal Pain Associated symptoms: no chills and no fever        Home Medications Prior to Admission medications   Medication Sig Start Date End Date Taking? Authorizing Provider  escitalopram (LEXAPRO) 10 MG tablet Take 1 tablet (10 mg total) by mouth daily. 01/14/20   Roma Schanz R, DO  fluticasone (FLONASE) 50 MCG/ACT nasal spray Place 2 sprays into both nostrils daily. 12/05/19   Saguier, Percell Miller, PA-C  levocetirizine (XYZAL) 5 MG tablet Take 1 tablet (5 mg total) by mouth every evening. 12/05/19   Saguier, Percell Miller, PA-C  nitrofurantoin, macrocrystal-monohydrate, (MACROBID) 100 MG capsule Take 1 capsule (100 mg total) by mouth 2 (two) times daily.  02/21/22   Debbrah Alar, NP  phenazopyridine (PYRIDIUM) 200 MG tablet Take 1 tablet (200 mg total) by mouth 3 (three) times daily as needed for pain. 10/05/22   Terrilyn Saver, NP  SPRINTEC 28 0.25-35 MG-MCG tablet  12/05/17   [provider]  sulfamethoxazole-trimethoprim (BACTRIM DS) 800-160 MG tablet Take 1 tablet by mouth 2 (two) times daily for 3 days. 10/05/22 10/08/22  Terrilyn Saver, NP      Allergies    Patient has no known allergies.    Review of Systems   Review of Systems  Constitutional:  Negative for chills and fever.  Gastrointestinal:  Positive for abdominal pain.  Genitourinary:  Positive for difficulty urinating.    Physical Exam Updated Vital Signs BP 129/83 (BP Location: Right Arm)   Pulse 86   Temp 98.3 F (36.8 C) (Oral)   Resp 16   Ht '5\' 8"'$  (1.727 m)   Wt 70.9 kg   LMP 09/23/2022   SpO2 100%   BMI 23.78 kg/m  Physical Exam Vitals and nursing note reviewed.  Constitutional:      Appearance: She is well-developed.     Comments: Teary eyed on evaluation  HENT:     Head: Normocephalic and atraumatic.  Cardiovascular:     Rate and Rhythm: Normal rate.  Pulmonary:     Effort: Pulmonary effort is normal.  Abdominal:     General: Abdomen is flat.  Skin:  General: Skin is warm and dry.  Neurological:     Mental Status: She is alert.     ED Results / Procedures / Treatments   Labs (all labs ordered are listed, but only abnormal results are displayed) Labs Reviewed - No data to display  EKG None  Radiology No results found.  Procedures Procedures    Medications Ordered in ED Medications - No data to display  ED Course/ Medical Decision Making/ A&P                             Medical Decision Making    Patient presents to the ED with a concern for no growth on her urine culture.  Evaluated by PCP 2 days ago, had a dipstick that was positive for nitrates, large amount of white blood cell count, large leukocytes and  treated for urinary tract infection with Bactrim.  Patient reports she has had ongoing UTIs over the last couple of months, she has not been referred to a specialist.  She reports she seen multiple doctors such as her gynecologist, her primary care physician.  She was prescribed Bactrim to take prophylactically in order to prevent any other urinary tract infection, however patient reports she did not want to take this medication.   She does not have any fever, no tachycardia here, no nausea or vomiting.  She is currently on antibiotics to help treat her infection.  Her last urine culture did grow E. coli and she is currently on Bactrim which is sensitive for this at this time.  I did discuss with patient further evaluation with the specialist seeing as these are pending so often.  Mother at the bedside is discussing if CT imaging would be warranted at this time.  I discussed with patient and mother that I do not suspect kidney stone as patient feels pretty comfortable, has stable vital signs and improvement after antibiotic.  Did discuss she is concerned for malignancy after googling this, however I do not feel that CT is warranted at this time with no specialist having work her up for this.  Patient is agreeable to following up with specialist at this time.  She is hemodynamically stable for further evaluation.     Portions of this note were generated with [employer]. Dictation errors may occur despite best attempts at proofreading.   Final Clinical Impression(s) / ED Diagnoses Final diagnoses:  Acute cystitis with hematuria    Rx / DC Orders ED Discharge Orders     None         Janeece Fitting, PA-C 10/07/22 [place], Ryan [place], DO 10/07/22 1459

## 2022-10-07 NOTE — Discharge Instructions (Addendum)
You were given the referral to the urologist, please schedule an appointment in order to be further evaluated.

## 2022-10-07 NOTE — ED Notes (Signed)
Discharge paperwork reviewed entirely with patient, including Rx's and follow up care. Pain was under control. Pt verbalized understanding as well as all parties involved. No questions or concerns voiced at the time of discharge. No acute distress noted.   Pt ambulated out to PVA without incident or assistance.
